# Patient Record
Sex: Male | Born: 1985 | Race: Black or African American | Hispanic: No | Marital: Single | Smoking: Former smoker
Health system: Southern US, Community
[De-identification: ages and names within clinical notes are randomized; demographics above are authoritative.]

## PROBLEM LIST (undated history)

## (undated) ENCOUNTER — Emergency Department (HOSPITAL_BASED_OUTPATIENT_CLINIC_OR_DEPARTMENT_OTHER): Discharge status: Against Medical Advice | Payer: PRIVATE HEALTH INSURANCE

---

## 2009-01-13 ENCOUNTER — Ambulatory Visit: Payer: Self-pay | Admitting: Diagnostic Radiology

## 2009-01-13 ENCOUNTER — Emergency Department (HOSPITAL_BASED_OUTPATIENT_CLINIC_OR_DEPARTMENT_OTHER): Admission: EM | Admit: 2009-01-13 | Discharge: 2009-01-13 | Payer: Self-pay | Admitting: Emergency Medicine

## 2012-05-18 ENCOUNTER — Encounter (HOSPITAL_BASED_OUTPATIENT_CLINIC_OR_DEPARTMENT_OTHER): Payer: Self-pay | Admitting: *Deleted

## 2012-05-18 DIAGNOSIS — L02419 Cutaneous abscess of limb, unspecified: Secondary | ICD-10-CM | POA: Insufficient documentation

## 2012-05-18 NOTE — ED Notes (Signed)
Pt has an abcess on his lateral left lower leg since last week. Slightly warm to touch, no drainage.

## 2012-05-19 ENCOUNTER — Emergency Department (HOSPITAL_BASED_OUTPATIENT_CLINIC_OR_DEPARTMENT_OTHER)
Admission: EM | Admit: 2012-05-19 | Discharge: 2012-05-19 | Disposition: A | Discharge status: Home / Self Care | Payer: PRIVATE HEALTH INSURANCE | Attending: Psychiatry | Admitting: Psychiatry

## 2012-05-19 NOTE — ED Notes (Signed)
rec'd call from registrar stating that pt decided to leave without seeing EDP, NAD noted.

## 2013-09-24 ENCOUNTER — Encounter (HOSPITAL_BASED_OUTPATIENT_CLINIC_OR_DEPARTMENT_OTHER): Payer: Self-pay | Admitting: Emergency Medicine

## 2013-09-24 ENCOUNTER — Emergency Department (HOSPITAL_BASED_OUTPATIENT_CLINIC_OR_DEPARTMENT_OTHER)
Admission: EM | Admit: 2013-09-24 | Discharge: 2013-09-24 | Disposition: A | Discharge status: Home / Self Care | Payer: PRIVATE HEALTH INSURANCE | Attending: Emergency Medicine | Admitting: Emergency Medicine

## 2013-09-24 DIAGNOSIS — F17200 Nicotine dependence, unspecified, uncomplicated: Secondary | ICD-10-CM | POA: Insufficient documentation

## 2013-09-24 DIAGNOSIS — J029 Acute pharyngitis, unspecified: Secondary | ICD-10-CM | POA: Insufficient documentation

## 2013-09-24 DIAGNOSIS — B309 Viral conjunctivitis, unspecified: Secondary | ICD-10-CM | POA: Insufficient documentation

## 2013-09-24 MED ORDER — ERYTHROMYCIN 5 MG/GM OP OINT
TOPICAL_OINTMENT | Freq: Four times a day (QID) | OPHTHALMIC | Status: DC
  Administered 2013-09-24: via OPHTHALMIC
  Filled 2013-09-24: qty 3.5

## 2013-09-24 NOTE — ED Provider Notes (Signed)
CSN: [id number]     Arrival date & time 09/24/13  2231 History   None    Chief Complaint  Patient presents with  . Eye Irritation      (Consider location/radiation/quality/duration/timing/severity/associated sxs/prior Treatment) HPI 28 year old male who this morning with cough, nasal congestion, sore throat and eye irritation. His eyes are extremely red with watery discharge and a mild foreign body sensation. He denies blurred vision. He denies fever, body aches, nausea, vomiting or diarrhea. He does have some mild anterior cervical lymphadenopathy.  History reviewed. No pertinent past medical history. History reviewed. No pertinent past surgical history. No family history on file. History  Substance Use Topics  . Smoking status: Current Every Day Smoker  . Smokeless tobacco: Not on file  . Alcohol Use: No    Review of Systems  All other systems reviewed and are negative.    Allergies  Review of patient's allergies indicates no known allergies.  Home Medications  No current outpatient prescriptions on file. BP 125/80  Pulse 79  Temp(Src) 98.5 F (36.9 C)  Resp 20  Ht 5\' 6"  (1.676 m)  Wt 180 lb (81.647 kg)  BMI 29.07 kg/m2  SpO2 99%  Physical Exam General: Well-developed, well-nourished male in no acute distress; appearance consistent with age of record HENT: normocephalic; atraumatic; no pharyngeal erythema or exudate; nasal congestion Eyes: pupils equal, round and reactive to light; extraocular muscles intact; bilateral conjunctival injection with serous exudate Neck: supple; mild anterior cervical lymphadenopathy greater on left Heart: regular rate and rhythm; no murmurs, rubs or gallops Lungs: clear to auscultation bilaterally; occasional cough Abdomen: soft; nondistended Extremities: No deformity; full range of motion Neurologic: Awake, alert and oriented; motor function intact in all extremities and symmetric; no facial droop Skin: Warm and dry Psychiatric:  Normal mood and affect    ED Course  Procedures (including critical care time)    MDM   Patient advised that this is a viral conjunctivitis and is highly contagious. We will treat him with antibiotic ointment for symptomatic relief and prevention of secondary bacterial infection.    Hanley SeamenJohn L Leia Coletti, MD 09/24/13 [phone]

## 2013-09-24 NOTE — ED Notes (Signed)
D/c home with ride 

## 2013-09-24 NOTE — ED Notes (Signed)
C/o cough, runny nose, sneezing, and reddened eyes.  Onset this morning.  Denies fever, body aches, nausea, vomiting, or diarrhea.

## 2013-09-24 NOTE — Discharge Instructions (Signed)

## 2014-04-14 ENCOUNTER — Emergency Department (HOSPITAL_BASED_OUTPATIENT_CLINIC_OR_DEPARTMENT_OTHER): Payer: PRIVATE HEALTH INSURANCE

## 2014-04-14 ENCOUNTER — Encounter (HOSPITAL_BASED_OUTPATIENT_CLINIC_OR_DEPARTMENT_OTHER): Payer: Self-pay | Admitting: Emergency Medicine

## 2014-04-14 ENCOUNTER — Emergency Department (HOSPITAL_BASED_OUTPATIENT_CLINIC_OR_DEPARTMENT_OTHER)
Admission: EM | Admit: 2014-04-14 | Discharge: 2014-04-14 | Disposition: A | Discharge status: Home / Self Care | Payer: PRIVATE HEALTH INSURANCE | Attending: Emergency Medicine | Admitting: Emergency Medicine

## 2014-04-14 DIAGNOSIS — Z87891 Personal history of nicotine dependence: Secondary | ICD-10-CM | POA: Insufficient documentation

## 2014-04-14 DIAGNOSIS — R0602 Shortness of breath: Secondary | ICD-10-CM | POA: Insufficient documentation

## 2014-04-14 DIAGNOSIS — M549 Dorsalgia, unspecified: Secondary | ICD-10-CM | POA: Insufficient documentation

## 2014-04-14 DIAGNOSIS — B9789 Other viral agents as the cause of diseases classified elsewhere: Secondary | ICD-10-CM | POA: Insufficient documentation

## 2014-04-14 DIAGNOSIS — B349 Viral infection, unspecified: Secondary | ICD-10-CM

## 2014-04-14 LAB — D-DIMER, QUANTITATIVE (NOT AT ARMC): D DIMER QUANT: 0.29 ug{FEU}/mL (ref 0.00–0.48)

## 2014-04-14 LAB — CBC WITH DIFFERENTIAL/PLATELET
BASOS ABS: 0 10*3/uL (ref 0.0–0.1)
BASOS PCT: 0 % (ref 0–1)
Eosinophils Absolute: 0 10*3/uL (ref 0.0–0.7)
Eosinophils Relative: 0 % (ref 0–5)
HCT: 41.9 % (ref 39.0–52.0)
Hemoglobin: 14 g/dL (ref 13.0–17.0)
LYMPHS PCT: 9 % — AB (ref 12–46)
Lymphs Abs: 0.9 10*3/uL (ref 0.7–4.0)
MCH: 28.4 pg (ref 26.0–34.0)
MCHC: 33.4 g/dL (ref 30.0–36.0)
MCV: 85 fL (ref 78.0–100.0)
Monocytes Absolute: 0.9 10*3/uL (ref 0.1–1.0)
Monocytes Relative: 8 % (ref 3–12)
NEUTROS ABS: 8.7 10*3/uL — AB (ref 1.7–7.7)
NEUTROS PCT: 83 % — AB (ref 43–77)
Platelets: 246 10*3/uL (ref 150–400)
RBC: 4.93 MIL/uL (ref 4.22–5.81)
RDW: 13.8 % (ref 11.5–15.5)
WBC: 10.5 10*3/uL (ref 4.0–10.5)

## 2014-04-14 LAB — BASIC METABOLIC PANEL
ANION GAP: 15 (ref 5–15)
BUN: 8 mg/dL (ref 6–23)
CHLORIDE: 97 meq/L (ref 96–112)
CO2: 26 mEq/L (ref 19–32)
Calcium: 9.7 mg/dL (ref 8.4–10.5)
Creatinine, Ser: 1 mg/dL (ref 0.50–1.35)
GFR calc non Af Amer: >90 mL/min (ref >90)
Glucose, Bld: 104 mg/dL — ABNORMAL HIGH (ref 70–99)
POTASSIUM: 3.9 meq/L (ref 3.7–5.3)
SODIUM: 138 meq/L (ref 137–147)

## 2014-04-14 LAB — URINALYSIS, ROUTINE W REFLEX MICROSCOPIC
Bilirubin Urine: NEGATIVE
Glucose, UA: NEGATIVE mg/dL
HGB URINE DIPSTICK: NEGATIVE
Ketones, ur: NEGATIVE mg/dL
LEUKOCYTES UA: NEGATIVE
NITRITE: NEGATIVE
PROTEIN: NEGATIVE mg/dL
Specific Gravity, Urine: 1.004 — ABNORMAL LOW (ref 1.005–1.030)
UROBILINOGEN UA: 1 mg/dL (ref 0.0–1.0)
pH: 6 (ref 5.0–8.0)

## 2014-04-14 MED ORDER — IBUPROFEN 800 MG PO TABS: 800.0000 mg | ORAL_TABLET | Freq: Three times a day (TID) | ORAL | Status: AC

## 2014-04-14 MED ORDER — IBUPROFEN 800 MG PO TABS
800.0000 mg | ORAL_TABLET | Freq: Once | ORAL | Status: AC
  Administered 2014-04-14: 800 mg via ORAL
  Filled 2014-04-14: qty 1

## 2014-04-14 NOTE — Discharge Instructions (Signed)

## 2014-04-14 NOTE — ED Notes (Signed)
Pt has recently had cold, with coughing.  Now pt has pain with deep breath and occasional sob with exertion.  Denies any long travel.

## 2014-04-14 NOTE — ED Provider Notes (Signed)
CSN: [id number]     Arrival date & time 04/14/14  1247 History   First MD Initiated Contact with Patient 04/14/14 1307     Chief Complaint  Patient presents with  . Shortness of Breath  . Back Pain     (Consider location/radiation/quality/duration/timing/severity/associated sxs/prior Treatment) HPI Comments: Pt comes in today with complaints of back pain and sob.pt states that he recently had a cold and cough. States that he hasn't had fever.stopped smoking about 1 month ago. He states that he is having sob on exertion. No cp.  The history is provided by the patient. No language interpreter was used.    No past medical history on file. No past surgical history on file. No family history on file. History  Substance Use Topics  . Smoking status: Former Games developer  . Smokeless tobacco: Not on file  . Alcohol Use: No    Review of Systems  Constitutional: Negative.   HENT: Negative.   Respiratory: Positive for shortness of breath.   Cardiovascular: Negative.       Allergies  Review of patient's allergies indicates no known allergies.  Home Medications   Prior to Admission medications   Not on File   BP 130/70  Pulse 119  Temp(Src) 100.1 F (37.8 C) (Oral)  Resp 22  Ht  (1.676 m)  Wt 175 lb (79.379 kg)  BMI 28.26 kg/m2  SpO2 98% Physical Exam  Nursing note and vitals reviewed. Constitutional: He is oriented to person, place, and time. He appears well-developed and well-nourished.  HENT:  Head: Normocephalic.  Right Ear: External ear normal.  Left Ear: External ear normal.  Nose: Rhinorrhea present.  Cardiovascular: Normal rate and regular rhythm.   Pulmonary/Chest: Effort normal and breath sounds normal.  Abdominal: Soft. Bowel sounds are normal. There is no tenderness.  Musculoskeletal:  Mid back tender with palpation  Neurological: He is alert and oriented to person, place, and time.  Skin: Skin is warm and dry.    ED Course  Procedures (including  critical care time) Labs Review Labs Reviewed  CBC WITH DIFFERENTIAL - Abnormal; Notable for the following:    Neutrophils Relative % 83 (*)    Neutro Abs 8.7 (*)    Lymphocytes Relative 9 (*)    All other components within normal limits  BASIC METABOLIC PANEL - Abnormal; Notable for the following:    Glucose, Bld 104 (*)    All other components within normal limits  URINALYSIS, ROUTINE W REFLEX MICROSCOPIC - Abnormal; Notable for the following:    Specific Gravity, Urine 1.004 (*)    All other components within normal limits  D-DIMER, QUANTITATIVE    Imaging Review Dg Chest 2 View  04/14/2014   CLINICAL DATA:  Cough and chills.  Mid back pain.  EXAM: CHEST  2 VIEW  COMPARISON:  01/13/2009.  FINDINGS: On the frontal view, there is prominence of the left hilum, which is not evident on the lateral view. This is likely vascular in origin accentuated by slight rotation. No convincing mediastinal or hilar mass. No convincing adenopathy.  Heart is normal in size and configuration. Normal mediastinal contours.  Clear lungs.  No pleural effusion or pneumothorax.  The bony thorax is unremarkable.  IMPRESSION: No active cardiopulmonary disease.   Electronically Signed   By: Amie Portland M.D.   On: 04/14/2014 13:25    Date: 04/14/2014  Rate: 105  Rhythm: sinus tachycardia  QRS Axis: normal  Intervals: normal  ST/T Wave abnormalities: normal  Conduction Disutrbances:none  Narrative Interpretation:   Old EKG Reviewed: none available     MDM   Final diagnoses:  Viral illness    Likely viral in nature. Pt is feeling better with ibuprofen. Not showing meningeal sign.pt is okay with plan    Teressa Lower, NP 04/14/14 1542

## 2014-04-17 NOTE — ED Provider Notes (Signed)
Medical screening examination/treatment/procedure(s) were performed by non-physician practitioner and as supervising physician I was immediately available for consultation/collaboration.   EKG Interpretation   Date/Time:  Friday April 14 2014 13:59:49 EDT Ventricular Rate:  105 PR Interval:  146 QRS Duration: 82 QT Interval:  310 QTC Calculation: 409 R Axis:   44 Text Interpretation:  Sinus tachycardia Possible Left atrial enlargement  Borderline ECG ED PHYSICIAN INTERPRETATION AVAILABLE IN [facility]  Confirmed by TEST, Record ([id number]) on 04/16/2014 8:31:13 AM        Candyce Churn III, MD 04/17/14 [phone]

## 2020-12-26 ENCOUNTER — Encounter (HOSPITAL_BASED_OUTPATIENT_CLINIC_OR_DEPARTMENT_OTHER): Payer: Self-pay | Admitting: *Deleted

## 2020-12-26 ENCOUNTER — Emergency Department (HOSPITAL_BASED_OUTPATIENT_CLINIC_OR_DEPARTMENT_OTHER)
Admission: EM | Admit: 2020-12-26 | Discharge: 2020-12-26 | Disposition: A | Discharge status: Home / Self Care | Payer: PRIVATE HEALTH INSURANCE | Attending: Emergency Medicine | Admitting: Emergency Medicine

## 2020-12-26 ENCOUNTER — Other Ambulatory Visit: Payer: Self-pay

## 2020-12-26 DIAGNOSIS — U071 COVID-19: Secondary | ICD-10-CM | POA: Insufficient documentation

## 2020-12-26 DIAGNOSIS — Z87891 Personal history of nicotine dependence: Secondary | ICD-10-CM | POA: Insufficient documentation

## 2020-12-26 DIAGNOSIS — J028 Acute pharyngitis due to other specified organisms: Secondary | ICD-10-CM

## 2020-12-26 LAB — RESP PANEL BY RT-PCR (FLU A&B, COVID) ARPGX2
Influenza A by PCR: NEGATIVE
Influenza B by PCR: NEGATIVE
SARS Coronavirus 2 by RT PCR: POSITIVE — AB

## 2020-12-26 LAB — GROUP A STREP BY PCR: Group A Strep by PCR: NOT DETECTED

## 2020-12-26 MED ORDER — KETOROLAC TROMETHAMINE 30 MG/ML IJ SOLN
30.0000 mg | Freq: Once | INTRAMUSCULAR | Status: AC
  Administered 2020-12-26: 30 mg via INTRAMUSCULAR
  Filled 2020-12-26: qty 1

## 2020-12-26 NOTE — ED Triage Notes (Signed)
C/o sore throat  x 4 days.

## 2020-12-26 NOTE — ED Provider Notes (Signed)
[facility] EMERGENCY DEPARTMENT Provider Note   CSN: [id number] Arrival date & time: 12/26/20  1703     History Chief Complaint  Patient presents with  . Sore Throat    Steve Duncan is a 35 y.o. male.  Patient presents to the emergency department today for evaluation of sore throat that started 4 days ago but became worse in the past 2 to 3 days.  Patient reports pain with swallowing.  No fevers, ear pain, runny nose, cough.  No nausea, vomiting, or diarrhea.  No known sick contacts.  He has been using over-the-counter cold and flu medications without much improvement. The onset of this condition was acute. The course is constant. Aggravating factors: swallowing. Alleviating factors: none.          History reviewed. No pertinent past medical history.  There are no problems to display for this patient.   History reviewed. No pertinent surgical history.     No family history on file.  Social History   Tobacco Use  . Smoking status: Former Smoker  Substance Use Topics  . Alcohol use: No  . Drug use: No    Home Medications Prior to Admission medications   Medication Sig Start Date End Date Taking? Authorizing Provider  ibuprofen (ADVIL,MOTRIN) 800 MG tablet Take 1 tablet (800 mg total) by mouth 3 (three) times daily. 04/14/14   Teressa Lower, NP    Allergies    Patient has no known allergies.  Review of Systems   Review of Systems  Constitutional: Negative for chills, fatigue and fever.  HENT: Positive for sore throat. Negative for congestion, ear pain, rhinorrhea and sinus pressure.   Eyes: Negative for redness.  Respiratory: Negative for cough and wheezing.   Gastrointestinal: Negative for abdominal pain, diarrhea, nausea and vomiting.  Genitourinary: Negative for dysuria.  Musculoskeletal: Negative for myalgias and neck stiffness.  Skin: Negative for rash.  Neurological: Negative for headaches.  Hematological: Negative for adenopathy.     Physical Exam Updated Vital Signs BP 122/81 (BP Location: Left Arm)   Pulse 93   Temp 99.7 F (37.6 C) (Oral)   Resp 18   Ht 5\' 6"  (1.676 m)   Wt 83.9 kg   SpO2 100%   BMI 29.86 kg/m   Physical Exam Vitals and nursing note reviewed.  Constitutional:      Appearance: He is well-developed.  HENT:     Head: Normocephalic and atraumatic.     Jaw: No trismus.     Right Ear: Tympanic membrane, ear canal and external ear normal.     Left Ear: Tympanic membrane, ear canal and external ear normal.     Nose: Nose normal. No mucosal edema or rhinorrhea.     Mouth/Throat:     Mouth: Mucous membranes are not dry.     Pharynx: Uvula midline. Posterior oropharyngeal erythema present. No oropharyngeal exudate or uvula swelling.     Tonsils: No tonsillar abscesses.  Eyes:     General:        Right eye: No discharge.        Left eye: No discharge.     Conjunctiva/sclera: Conjunctivae normal.  Cardiovascular:     Rate and Rhythm: Normal rate and regular rhythm.     Heart sounds: Normal heart sounds.  Pulmonary:     Effort: Pulmonary effort is normal. No respiratory distress.     Breath sounds: Normal breath sounds. No wheezing or rales.  Abdominal:     Palpations: Abdomen is  soft.     Tenderness: There is no abdominal tenderness.  Musculoskeletal:     Cervical back: Normal range of motion and neck supple.  Lymphadenopathy:     Cervical: No cervical adenopathy.  Skin:    General: Skin is warm and dry.  Neurological:     Mental Status: He is alert.     ED Results / Procedures / Treatments   Labs (all labs ordered are listed, but only abnormal results are displayed) Labs Reviewed  RESP PANEL BY RT-PCR (FLU A&B, COVID) ARPGX2 - Abnormal; Notable for the following components:      Result Value   SARS Coronavirus 2 by RT PCR POSITIVE (*)    All other components within normal limits  GROUP A STREP BY PCR    EKG None  Radiology No results found.  Procedures Procedures    Medications Ordered in ED Medications  ketorolac (TORADOL) 30 MG/ML injection 30 mg (30 mg Intramuscular Given 12/26/20 1733)    ED Course  I have reviewed the triage vital signs and the nursing notes.  Pertinent labs & imaging results that were available during my care of the patient were reviewed by me and considered in my medical decision making (see chart for details).  Patient seen and examined.  Will check strep test and COVID.  Medications ordered.   Vital signs reviewed and are as follows: BP 122/81 (BP Location: Left Arm)   Pulse 93   Temp 99.7 F (37.6 C) (Oral)   Resp 18   Ht 5\' 6"  (1.676 m)   Wt 83.9 kg   SpO2 100%   BMI 29.86 kg/m   6:40 PM COVID positive, strep neg.   Detailed discussion had with with patient regarding COVID-19 precautions and written instructions given as well.  We discussed need to isolate themselves for 5 days from onset of symptoms and have 24 hours of improvement prior to breaking isolation.  We discussed that when breaking isolation, mask wearing for 5 additional days is required.  We discussed signs symptoms to return which include worsening shortness of breath, trouble breathing, or increased work of breathing.  Also return with persistent vomiting, confusion, passing out, or if they have any other concerns. Counseled on the need for rest and good hydration. Discussed that high-risk contacts should be aware of positive result and they need to quarantine and be tested if they develop any symptoms. Patient verbalizes understanding.   Steve Duncan was evaluated in Emergency Department on 12/26/2020 for the symptoms described in the history of present illness. He was evaluated in the context of the global COVID-19 pandemic, which necessitated consideration that the patient might be at risk for infection with the SARS-CoV-2 virus that causes COVID-19. Institutional protocols and algorithms that pertain to the evaluation of patients at risk for COVID-19  are in a state of rapid change based on information released by regulatory bodies including the CDC and federal and state organizations. These policies and algorithms were followed during the patient's care in the ED.    MDM Rules/Calculators/A&P                          Patient with pharyngitis, positive COVID test.  No indication for antibiotics.  He does not have any high risk features for antiviral treatment today.  He looks well, no respiratory distress.  Conservative measures and rest as above.  Patient is on day 3 of illness and will need a least 2  additional days of isolation.   Final Clinical Impression(s) / ED Diagnoses Final diagnoses:  Pharyngitis due to other organism  COVID-19    Rx / DC Orders ED Discharge Orders    None       Renne Crigler, PA-C 12/26/20 1841    Melene Plan, DO 12/26/20 2000

## 2020-12-26 NOTE — Discharge Instructions (Signed)
Please read and follow all provided instructions.  Your diagnoses today include:  1. Pharyngitis due to other organism   2. COVID-19     Tests performed today include:  Vital signs. See below for your results today.   COVID test - POSITIVE  Strep test - NEGATIVE  Medications prescribed:   None  Take any prescribed medications only as directed. Treatment for your infection is aimed at treating the symptoms. There are no medications, such as antibiotics, that will cure your infection.   Home care instructions:  Follow any educational materials contained in this packet.   Your illness is contagious and can be spread to others, especially during the first 3 or 4 days. It cannot be cured by antibiotics or other medicines. Take basic precautions such as washing your hands often, covering your mouth when you cough or sneeze, and avoiding public places where you could spread your illness to others.   Please continue drinking plenty of fluids.  Use over-the-counter medicines as needed as directed on packaging for symptom relief.  You may also use ibuprofen or tylenol as directed on packaging for pain or fever.  Do not take multiple medicines containing Tylenol or acetaminophen to avoid taking too much of this medication.  If you are positive for Covid-19, you should isolate yourself and not be exposed to other people for 5 days after your symptoms began. If you are not feeling better at day 5, you need to isolate yourself for a total of 10 days. If you are feeling better by day 5, you should wear a mask properly, over your nose and mouth, at all times while around other people until 10 days after your symptoms started.   Follow-up instructions: Please follow-up with your primary care provider as needed for further evaluation of your symptoms if you are not feeling better.   Return instructions:   Please return to the Emergency Department if you experience worsening symptoms.   Return to  the emergency department if you have worsening shortness of breath breathing or increased work of breathing, persistent vomiting  RETURN IMMEDIATELY IF you develop shortness of breath, confusion or altered mental status, a new rash, become dizzy, faint, or poorly responsive, or are unable to be cared for at home.  Please return if you have persistent vomiting and cannot keep down fluids or develop a fever that is not controlled by tylenol or motrin.    Please return if you have any other emergent concerns.  Additional Information:  Your vital signs today were: BP 122/81 (BP Location: Left Arm)   Pulse 93   Temp 99.7 F (37.6 C) (Oral)   Resp 18   Ht 5\' 6"  (1.676 m)   Wt 83.9 kg   SpO2 100%   BMI 29.86 kg/m  If your blood pressure (BP) was elevated above 135/85 this visit, please have this repeated by your doctor within one month. --------------
